# Patient Record
Sex: Female | Born: 1995 | Hispanic: No | Marital: Single | Smoking: Never smoker
Health system: Southern US, Community
[De-identification: ages and names within clinical notes are randomized; demographics above are authoritative.]

## PROBLEM LIST (undated history)

## (undated) DIAGNOSIS — F329 Major depressive disorder, single episode, unspecified: Secondary | ICD-10-CM

## (undated) DIAGNOSIS — F32A Depression, unspecified: Secondary | ICD-10-CM

## (undated) DIAGNOSIS — F419 Anxiety disorder, unspecified: Secondary | ICD-10-CM

## (undated) DIAGNOSIS — F909 Attention-deficit hyperactivity disorder, unspecified type: Secondary | ICD-10-CM

## (undated) HISTORY — DX: Anxiety disorder, unspecified: F41.9

## (undated) HISTORY — DX: Major depressive disorder, single episode, unspecified: F32.9

## (undated) HISTORY — DX: Depression, unspecified: F32.A

## (undated) HISTORY — DX: Attention-deficit hyperactivity disorder, unspecified type: F90.9

---
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
## 2017-01-15 ENCOUNTER — Encounter (HOSPITAL_COMMUNITY): Payer: Self-pay | Admitting: Psychiatry

## 2017-01-15 ENCOUNTER — Ambulatory Visit (INDEPENDENT_AMBULATORY_CARE_PROVIDER_SITE_OTHER): Payer: 59 | Admitting: Psychiatry

## 2017-01-15 DIAGNOSIS — F902 Attention-deficit hyperactivity disorder, combined type: Secondary | ICD-10-CM

## 2017-01-15 DIAGNOSIS — F900 Attention-deficit hyperactivity disorder, predominantly inattentive type: Secondary | ICD-10-CM | POA: Diagnosis not present

## 2017-01-15 MED ORDER — LISDEXAMFETAMINE DIMESYLATE 30 MG PO CAPS: 30.0000 mg | ORAL_CAPSULE | Freq: Every day | ORAL | 0 refills | Status: DC

## 2017-01-15 NOTE — Progress Notes (Signed)
BH MD/PA/NP OP Progress Note  01/15/2017 9:02 AM Natasha Collins  MRN:  [id number]  Chief Complaint:  Subjective:  I like new medication.  I'm not hair picking.  HPI: Natasha Collins came for her follow-up appointment.  She is taking Vyvanse 30 mg and she didn't like this medication.  Her attention, focus, multitasking and concentration is good.  She is not engaging in hair picking which she believed caused by Concerta.  She is in summer classes at [employer].  She like to do nursing in the future.  She is having good grades.  Patient denies any insomnia, irritability, crying spells, agitation, suicidal thoughts or any hopelessness.  Patient denies drinking alcohol or using any illegal substances.  Currently she is living with her father and stepmother.  She admitted not able to see therapist but wondering if she can resume counseling with Boneta Lucks.  Her energy level is good.  Her vital signs are stable.  Visit Diagnosis:    ICD-10-CM   1. ADHD (attention deficit hyperactivity disorder), combined type F90.2 lisdexamfetamine (VYVANSE) 30 MG capsule    DISCONTINUED: lisdexamfetamine (VYVANSE) 30 MG capsule    DISCONTINUED: lisdexamfetamine (VYVANSE) 30 MG capsule    Past Psychiatric History: Reviewed. Patient has history of ADD and seen multiple psychiatrist and provider in this office.  She used to take higher dose of Concerta but slowly and gradually it has been cut down due to anxiety and nervousness and lately causing hair picking.  She also took Lexapro but feeling much better and it was discontinued.  Patient denies any history of suicidal attempt, psychiatric inpatient treatment, psychosis, mania or any aggressive behavior.  Past Medical History:  Past Medical History:  Diagnosis Date  . ADHD (attention deficit hyperactivity disorder)   . Anxiety   . Depression    History reviewed. No pertinent surgical history.  Family Psychiatric History: Reviewed.  Family History: History reviewed. No  pertinent family history.  Social History:  Social History   Social History  . Marital status: Single    Spouse name: N/A  . Number of children: N/A  . Years of education: N/A   Social History Main Topics  . Smoking status: Never Smoker  . Smokeless tobacco: Never Used  . Alcohol use No  . Drug use: No  . Sexual activity: Not Currently    Partners: Male    Birth control/ protection: Pill   Other Topics Concern  . None   Social History Narrative  . None    Allergies: No Known Allergies  Metabolic Disorder Labs: Lab Results  Component Value Date   HGBA1C 5.1 06/05/2016   MPG 100 06/05/2016   No results found for: PROLACTIN No results found for: CHOL, TRIG, HDL, CHOLHDL, VLDL, LDLCALC   Current Medications: Current Outpatient Prescriptions  Medication Sig Dispense Refill  . lisdexamfetamine (VYVANSE) 30 MG capsule Take 1 capsule (30 mg total) by mouth daily. 30 capsule 0   No current facility-administered medications for this visit.     Neurologic: Headache: No Seizure: Yes Paresthesias: No  Musculoskeletal: Strength & Muscle Tone: within normal limits Gait & Station: normal Patient leans: N/A  Psychiatric Specialty Exam: ROS  Blood pressure 102/68, pulse 82, height 5' 5.75" (1.67 m), weight 135 lb 6.4 oz (61.4 kg).Body mass index is 22.02 kg/m.  General Appearance: Casual and Pleasant  Eye Contact:  Good  Speech:  Clear and Coherent  Volume:  Normal  Mood:  Euthymic  Affect:  Appropriate  Thought Process:  Goal  Directed  Orientation:  Full (Time, Place, and Person)  Thought Content: Logical   Suicidal Thoughts:  No  Homicidal Thoughts:  No  Memory:  Immediate;   Good Recent;   Good Remote;   Good  Judgement:  Good  Insight:  Good  Psychomotor Activity:  Normal  Concentration:  Concentration: Good and Attention Span: Good  Recall:  Good  Fund of Knowledge: Good  Language: Good  Akathisia:  No  Handed:  Right  AIMS (if indicated):  0   Assets:  Communication Skills Desire for Improvement Resilience Social Support Talents/Skills  ADL's:  Intact  Cognition: WNL  Sleep:  Good    Assessment: Attention deficit disorder, inattentive type.  Plan: Patient like Vyvanse and she had no side effects.  She has no tremors shakes or any EPS.  She is pleased that it is not causing hair picking.  Continue Vyvanse 30 mg daily.  She also like to resume counseling with Boneta LucksJennifer Brown and we will schedule appointment.  Recommended to call us back if she has any question, concern or if she feels worsening of the symptom.  Follow-up in 3 months.  ARFEEN,SYED T., MD 01/15/2017, 9:02 AM

## 2017-02-18 ENCOUNTER — Ambulatory Visit (INDEPENDENT_AMBULATORY_CARE_PROVIDER_SITE_OTHER): Payer: 59 | Admitting: Psychiatry

## 2017-02-18 DIAGNOSIS — F902 Attention-deficit hyperactivity disorder, combined type: Secondary | ICD-10-CM

## 2017-02-18 DIAGNOSIS — F411 Generalized anxiety disorder: Secondary | ICD-10-CM

## 2017-02-19 NOTE — Progress Notes (Signed)
   THERAPIST PROGRESS NOTE  Session Time: 2:05-3:00  Participation Level: Active  Behavioral Response: CasualAlertEuthymicAppropriatelyTearful  Type of Therapy: Individual Therapy  Treatment Goals addressed: Coping  Interventions: CBT, Strength-based and Supportive  Summary: Natasha Collins is a 21 y.o. female who presents with depression, ADHD.   Suicidal/Homicidal: Nowithout intent/plan  Therapist Response:  This was Pt.'s first session in over a year since going to college. Pt. Reported that the year was mostly successful. Pt. Reported that she had some success socially in letting go of toxic friend group from home. Pt. 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 Pt. Discussed challenge of underage drinking citation and punishment from her father of having to come home every weekend which made it difficult to make friends and transition socially on campus. Pt. 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 Significant time in session was spent processing ongoing them of vulnerability and allowing herself to be fully present in emotionally intimate and loving relationships with her father and new boyfriend. Pt. Was encouraged to pursue counseling with on campus counseling center. Pt. Was also recommended bibliotherapy to continue work on vulnerability and communicating her feelings to significant others.   Plan: Pt. To continue with CBT-based therapy. Return again in 4 weeks.  Diagnosis:Axis I: Depressive Disorder NOS; PTSD  Axis II: No diagnosis  Shaune Pollack, [facility] 02/19/2017

## 2017-04-09 ENCOUNTER — Ambulatory Visit (HOSPITAL_COMMUNITY): Payer: 59 | Admitting: Psychiatry

## 2017-05-20 ENCOUNTER — Ambulatory Visit (HOSPITAL_COMMUNITY): Payer: 59 | Admitting: Psychiatry

## 2021-04-16 ENCOUNTER — Other Ambulatory Visit (HOSPITAL_BASED_OUTPATIENT_CLINIC_OR_DEPARTMENT_OTHER): Payer: Self-pay

## 2021-04-16 MED ORDER — INFLUENZA VAC SPLIT QUAD 0.5 ML IM SUSY
PREFILLED_SYRINGE | INTRAMUSCULAR | 0 refills | Status: DC
  Filled 2021-04-16: qty 0.5, 1d supply, fill #0

## 2021-06-02 ENCOUNTER — Other Ambulatory Visit: Payer: Self-pay

## 2021-06-02 ENCOUNTER — Ambulatory Visit: Payer: Managed Care, Other (non HMO) | Admitting: Family Medicine

## 2021-06-02 ENCOUNTER — Encounter: Payer: Self-pay | Admitting: Family Medicine

## 2021-06-02 VITALS — BP 110/70 | HR 94 | Temp 97.6°F | Ht 66.0 in | Wt 138.2 lb

## 2021-06-02 DIAGNOSIS — J45990 Exercise induced bronchospasm: Secondary | ICD-10-CM | POA: Diagnosis not present

## 2021-06-02 DIAGNOSIS — Z0000 Encounter for general adult medical examination without abnormal findings: Secondary | ICD-10-CM

## 2021-06-02 MED ORDER — ALBUTEROL SULFATE HFA 108 (90 BASE) MCG/ACT IN AERS: INHALATION_SPRAY | RESPIRATORY_TRACT | 0 refills | Status: DC

## 2021-06-02 NOTE — Progress Notes (Signed)
New Patient Office Visit  Subjective:  Patient ID: Natasha Collins, female    DOB: [birth year]  Age: 25 y.o. MRN: [id number]  CC:  Chief Complaint  Patient presents with   Establish Care    NP/establish care concerns about chest becoming tight after physical activity. Would like titers drawn.     HPI Natasha Collins presents for establishment of care a health check for nursing school and tightness in the chest with aerobic activity.  Patient does not smoke and has no history of asthma.  No known family history of asthma.  She notices tightness when she is exerting herself aerobically.  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  She is scheduled to start nursing school soon and needs titers drawn.  Past Medical History:  Diagnosis Date   ADHD (attention deficit hyperactivity disorder)    Anxiety    Depression     History reviewed. No pertinent surgical history.  Family History  Problem Relation Age of Onset   Alcohol abuse Mother     Social History   Socioeconomic History   Marital status: Single    Spouse name: Not on file   Number of children: Not on file   Years of education: Not on file   Highest education level: Not on file  Occupational History   Not on file  Tobacco Use   Smoking status: Never   Smokeless tobacco: Never  Vaping Use   Vaping Use: Never used  Substance and Sexual Activity   Alcohol use: Yes    Comment: social   Drug use: No   Sexual activity: Yes    Partners: Male    Birth control/protection: I.U.D.  Other Topics Concern   Not on file  Social History Narrative   Not on file   Social Determinants of Health   Financial Resource Strain: Not on file  Food Insecurity: Not on file  Transportation Needs: Not on file  Physical Activity: Not on file  Stress: Not on file  Social Connections: Not on file  Intimate Partner Violence: Not on file    ROS Review of Systems   Constitutional:  Negative for chills, diaphoresis, fatigue, fever and unexpected weight change.  HENT: Negative.    Eyes:  Negative for photophobia and visual disturbance.  Respiratory:  Positive for wheezing. Negative for shortness of breath.   Cardiovascular: Negative.   Gastrointestinal: Negative.   Genitourinary: Negative.   Musculoskeletal:  Negative for gait problem and joint swelling.  Neurological:  Negative for speech difficulty and weakness.  Psychiatric/Behavioral: Negative.    Depression screen [facility] 2/9 06/02/2021  Decreased Interest 0  Down, Depressed, Hopeless 0  PHQ - 2 Score 0     Objective:   Today's Vitals: BP 110/70 (BP Location: Right Arm, Patient Position: Sitting, Cuff Size: Normal)   Pulse 94   Temp 97.6 F (36.4 C) (Temporal)   Ht 5\' 6"  (1.676 m)   Wt 138 lb 3.2 oz (62.7 kg)   SpO2 98%   BMI 22.31 kg/m   Physical Exam Vitals and nursing note reviewed.  Constitutional:      General: She is not in acute distress.    Appearance: Normal appearance. She is normal weight. She is not ill-appearing, toxic-appearing or diaphoretic.  HENT:     Head: Normocephalic and atraumatic.     Right Ear: Tympanic membrane, ear canal and external ear normal.     Left  Ear: Tympanic membrane, ear canal and external ear normal.     Mouth/Throat:     Mouth: Mucous membranes are moist.     Pharynx: Oropharynx is clear. No oropharyngeal exudate or posterior oropharyngeal erythema.  Eyes:     General:        Right eye: No discharge.        Left eye: No discharge.     Extraocular Movements: Extraocular movements intact.     Conjunctiva/sclera: Conjunctivae normal.     Pupils: Pupils are equal, round, and reactive to light.  Neck:     Vascular: No carotid bruit.  Cardiovascular:     Rate and Rhythm: Normal rate and regular rhythm.  Pulmonary:     Effort: Pulmonary effort is normal.     Breath sounds: Normal breath sounds.  Abdominal:     General: Bowel sounds are  normal.  Musculoskeletal:     Cervical back: No rigidity or tenderness.     Right lower leg: No edema.     Left lower leg: No edema.  Lymphadenopathy:     Cervical: No cervical adenopathy.  Skin:    General: Skin is warm and dry.  Neurological:     Mental Status: She is alert and oriented to person, place, and time.  Psychiatric:        Mood and Affect: Mood normal.        Behavior: Behavior normal.    Assessment & Plan:   Problem List Items Addressed This Visit   None Visit Diagnoses     Healthcare maintenance    -  Primary   Relevant Orders   Measles/Mumps/Rubella Immunity   Varicella zoster antibody, IgG   Hepatitis B surface antibody,quantitative   QuantiFERON-TB Gold Plus   Pulmonary function test   CBC   Comprehensive metabolic panel   Exercise-induced asthma       Relevant Medications   albuterol (VENTOLIN HFA) 108 (90 Base) MCG/ACT inhaler       Outpatient Encounter Medications as of 06/02/2021  Medication Sig   azithromycin (ZITHROMAX) 250 MG tablet Take 250 mg by mouth as directed.   levonorgestrel (MIRENA, 52 MG,) 20 MCG/DAY IUD Mirena 20 mcg/24 hours (8 yrs) 52 mg intrauterine device  Take by intrauterine route.   [DISCONTINUED] albuterol (VENTOLIN HFA) 108 (90 Base) MCG/ACT inhaler One puff prior to aerobic activity. No more than 3 daily.   albuterol (VENTOLIN HFA) 108 (90 Base) MCG/ACT inhaler One puff prior to aerobic activity. No more than 3 daily.   [DISCONTINUED] influenza vac split quadrivalent PF (FLUARIX) 0.5 ML injection Inject into the muscle.   [DISCONTINUED] lisdexamfetamine (VYVANSE) 30 MG capsule Take 1 capsule (30 mg total) by mouth daily.   No facility-administered encounter medications on file as of 06/02/2021.    Follow-up: Return in about 3 months (around 09/02/2021).   Information was given on health maintenance and disease prevention as well as exercise-induced asthma.  She will use a Ventolin inhaler prior to aerobic activity, no  more than 3 times daily.  Also for formal lung function testing.  Checking titers required for nursing school.  To have gotten Ms. Laurine Blazer ready to go would you ask her to make sure the pharmacy shows her how to use the inhaler but she is going to go to the lab  Mliss Sax, MD

## 2021-06-03 LAB — COMPREHENSIVE METABOLIC PANEL
ALT: 23 U/L (ref 0–35)
AST: 24 U/L (ref 0–37)
Albumin: 4.4 g/dL (ref 3.5–5.2)
Alkaline Phosphatase: 62 U/L (ref 39–117)
BUN: 19 mg/dL (ref 6–23)
CO2: 29 mEq/L (ref 19–32)
Calcium: 8.9 mg/dL (ref 8.4–10.5)
Chloride: 104 mEq/L (ref 96–112)
Creatinine, Ser: 0.89 mg/dL (ref 0.40–1.20)
GFR: 91.47 mL/min (ref >60.00)
Glucose, Bld: 95 mg/dL (ref 70–99)
Potassium: 4.1 mEq/L (ref 3.5–5.1)
Sodium: 140 mEq/L (ref 135–145)
Total Bilirubin: 0.4 mg/dL (ref 0.2–1.2)
Total Protein: 6.8 g/dL (ref 6.0–8.3)

## 2021-06-03 LAB — CBC
HCT: 40.1 % (ref 36.0–46.0)
Hemoglobin: 13.7 g/dL (ref 12.0–15.0)
MCHC: 34.1 g/dL (ref 30.0–36.0)
MCV: 95.2 fl (ref 78.0–100.0)
Platelets: 310 10*3/uL (ref 150.0–400.0)
RBC: 4.22 Mil/uL (ref 3.87–5.11)
RDW: 12.6 % (ref 11.5–15.5)
WBC: 5.9 10*3/uL (ref 4.0–10.5)

## 2021-06-04 LAB — QUANTIFERON-TB GOLD PLUS
Mitogen-NIL: >10 IU/mL
NIL: 0.02 IU/mL
QuantiFERON-TB Gold Plus: NEGATIVE
TB1-NIL: 0 IU/mL
TB2-NIL: 0 IU/mL

## 2021-06-04 LAB — MEASLES/MUMPS/RUBELLA IMMUNITY
Mumps IgG: 37 AU/mL
Rubella: 2.1 Index
Rubeola IgG: >300 AU/mL

## 2021-06-04 LAB — VARICELLA ZOSTER ANTIBODY, IGG: Varicella IgG: 309 index

## 2021-06-04 LAB — HEPATITIS B SURFACE ANTIBODY, QUANTITATIVE: Hepatitis B-Post: 60 m[IU]/mL (ref >=10)

## 2021-09-02 ENCOUNTER — Ambulatory Visit: Payer: Managed Care, Other (non HMO) | Admitting: Family Medicine

## 2021-09-09 ENCOUNTER — Other Ambulatory Visit: Payer: Self-pay

## 2021-09-09 ENCOUNTER — Ambulatory Visit: Payer: Managed Care, Other (non HMO) | Admitting: Family Medicine

## 2021-09-09 ENCOUNTER — Encounter: Payer: Self-pay | Admitting: Family Medicine

## 2021-09-09 VITALS — BP 102/68 | HR 69 | Temp 97.3°F | Ht 66.0 in | Wt 136.4 lb

## 2021-09-09 DIAGNOSIS — B360 Pityriasis versicolor: Secondary | ICD-10-CM | POA: Insufficient documentation

## 2021-09-09 DIAGNOSIS — J45990 Exercise induced bronchospasm: Secondary | ICD-10-CM

## 2021-09-09 DIAGNOSIS — J4540 Moderate persistent asthma, uncomplicated: Secondary | ICD-10-CM

## 2021-09-09 DIAGNOSIS — J301 Allergic rhinitis due to pollen: Secondary | ICD-10-CM

## 2021-09-09 MED ORDER — CETIRIZINE HCL 10 MG PO TABS: 10.0000 mg | ORAL_TABLET | Freq: Every day | ORAL | 11 refills | Status: AC

## 2021-09-09 MED ORDER — FLUTICASONE-SALMETEROL 250-50 MCG/ACT IN AEPB: 1.0000 | INHALATION_SPRAY | Freq: Two times a day (BID) | RESPIRATORY_TRACT | 2 refills | Status: AC

## 2021-09-09 MED ORDER — ALBUTEROL SULFATE HFA 108 (90 BASE) MCG/ACT IN AERS: INHALATION_SPRAY | RESPIRATORY_TRACT | 0 refills | Status: DC

## 2021-09-09 NOTE — Progress Notes (Signed)
? ?Established Patient Office Visit ? ?Subjective:  ?Patient ID: Natasha Collins, female    DOB: [birth year]  Age: 26 y.o. MRN: [id number] ? ?CC:  ?Chief Complaint  ?Patient presents with  ? Follow-up  ?  Follow up on asthma, no concerns.   ? ? ?HPI ?Natasha Collins presents for follow-up of reactive airway disease with activity induced asthma.  Here today for albuterol worked great for use prior to exercise.  Over the last week or so while walking outside she developed itchy watery eyes sneezing and postnasal drip.  She also noticed some tightness in her chest.  Tells me that her grandmother remembers her coughing a lot this time of year when she was a little girl.  She has been exercising more often with the warmer weather and has been using her albuterol 4 times weekly.  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 ? ?Past Medical History:  ?Diagnosis Date  ? ADHD (attention deficit hyperactivity disorder)   ? Anxiety   ? Depression   ? ? ?No past surgical history on file. ? ?Family History  ?Problem Relation Age of Onset  ? Alcohol abuse Mother   ? ? ?Social History  ? ?Socioeconomic History  ? Marital status: Single  ?  Spouse name: Not on file  ? Number of children: Not on file  ? Years of education: Not on file  ? Highest education level: Not on file  ?Occupational History  ? Not on file  ?Tobacco Use  ? Smoking status: Never  ? Smokeless tobacco: Never  ?Vaping Use  ? Vaping Use: Never used  ?Substance and Sexual Activity  ? Alcohol use: Yes  ?  Comment: social  ? Drug use: No  ? Sexual activity: Yes  ?  Partners: Male  ?  Birth control/protection: I.U.D.  ?Other Topics Concern  ? Not on file  ?Social History Narrative  ? Not on file  ? ?Social Determinants of Health  ? ?Financial Resource Strain: Not on file  ?Food Insecurity: Not on file  ?Transportation Needs: Not on file  ?Physical Activity: Not on file  ?Stress: Not on file  ?Social Connections: Not on file  ?Intimate Partner Violence: Not on file   ? ? ?Outpatient Medications Prior to Visit  ?Medication Sig Dispense Refill  ? levonorgestrel (MIRENA, 52 MG,) 20 MCG/DAY IUD Mirena 20 mcg/24 hours (8 yrs) 52 mg intrauterine device ? Take by intrauterine route.    ? albuterol (VENTOLIN HFA) 108 (90 Base) MCG/ACT inhaler One puff prior to aerobic activity. No more than 3 daily. 8 g 0  ? azithromycin (ZITHROMAX) 250 MG tablet Take 250 mg by mouth as directed.    ? ?No facility-administered medications prior to visit.  ? ? ?No Known Allergies ? ?ROS ?Review of Systems  ?Constitutional:  Negative for diaphoresis, fatigue, fever and unexpected weight change.  ?HENT:  Positive for congestion, postnasal drip and sneezing.   ?Eyes:  Positive for itching. Negative for photophobia and visual disturbance.  ?Respiratory:  Positive for wheezing.   ?Cardiovascular: Negative.   ?Gastrointestinal: Negative.   ?Genitourinary: Negative.   ?Neurological:  Negative for speech difficulty, weakness and light-headedness.  ? ?  ?Objective:  ?  ?Physical Exam ?Vitals and nursing note reviewed.  ?Constitutional:   ?   General: She is not in acute distress. ?   Appearance: Normal appearance. She is normal weight. She is not ill-appearing, toxic-appearing or diaphoretic.  ?HENT:  ?   Head: Normocephalic  and atraumatic.  ?   Right Ear: External ear normal.  ?   Left Ear: External ear normal.  ?   Mouth/Throat:  ?   Mouth: Mucous membranes are moist.  ?   Pharynx: Oropharynx is clear. No oropharyngeal exudate or posterior oropharyngeal erythema.  ?Eyes:  ?   General: No scleral icterus.    ?   Right eye: No discharge.     ?   Left eye: No discharge.  ?   Extraocular Movements: Extraocular movements intact.  ?   Conjunctiva/sclera: Conjunctivae normal.  ?   Pupils: Pupils are equal, round, and reactive to light.  ?Cardiovascular:  ?   Rate and Rhythm: Normal rate and regular rhythm.  ?Pulmonary:  ?   Effort: Pulmonary effort is normal. No respiratory distress.  ?   Breath sounds: Normal breath  sounds. No wheezing.  ?Musculoskeletal:  ?   Cervical back: No rigidity or tenderness.  ?Lymphadenopathy:  ?   Cervical: No cervical adenopathy.  ?Skin: ?   General: Skin is warm and dry.  ?Neurological:  ?   Mental Status: She is alert and oriented to person, place, and time.  ?Psychiatric:     ?   Mood and Affect: Mood normal.     ?   Behavior: Behavior normal.  ? ? ?BP 102/68 (BP Location: Right Arm, Patient Position: Sitting, Cuff Size: Normal)   Pulse 69   Temp (!) 97.3 ?F (36.3 ?C) (Temporal)   Ht 5\' 6"  (1.676 m)   Wt 136 lb 6.4 oz (61.9 kg)   SpO2 97%   BMI 22.02 kg/m?  ?Wt Readings from Last 3 Encounters:  ?09/09/21 136 lb 6.4 oz (61.9 kg)  ?06/02/21 138 lb 3.2 oz (62.7 kg)  ?02/22/14 126 lb 12.8 oz (57.5 kg) (64 %, Z= 0.37)*  ? ?* Growth percentiles are based on CDC (Girls, 2-20 Years) data.  ? ? ? ?Health Maintenance Due  ?Topic Date Due  ? HPV VACCINES (1 - 2-dose series) Never done  ? HIV Screening  Never done  ? Hepatitis C Screening  Never done  ? ? ?   ?Topic Date Due  ? HPV VACCINES (1 - 2-dose series) Never done  ? ? ?Lab Results  ?Component Value Date  ? TSH 0.97 06/05/2016  ? ?Lab Results  ?Component Value Date  ? WBC 5.9 06/02/2021  ? HGB 13.7 06/02/2021  ? HCT 40.1 06/02/2021  ? MCV 95.2 06/02/2021  ? PLT 310.0 06/02/2021  ? ?Lab Results  ?Component Value Date  ? NA 140 06/02/2021  ? K 4.1 06/02/2021  ? CO2 29 06/02/2021  ? GLUCOSE 95 06/02/2021  ? BUN 19 06/02/2021  ? CREATININE 0.89 06/02/2021  ? BILITOT 0.4 06/02/2021  ? ALKPHOS 62 06/02/2021  ? AST 24 06/02/2021  ? ALT 23 06/02/2021  ? PROT 6.8 06/02/2021  ? ALBUMIN 4.4 06/02/2021  ? CALCIUM 8.9 06/02/2021  ? GFR 91.47 06/02/2021  ? ?No results found for: CHOL ?No results found for: HDL ?No results found for: LDLCALC ?No results found for: TRIG ?No results found for: CHOLHDL ?Lab Results  ?Component Value Date  ? HGBA1C 5.1 06/05/2016  ? ? ?  ?Assessment & Plan:  ? ?Problem List Items Addressed This Visit   ?None ?Visit Diagnoses   ? ?  Seasonal allergic rhinitis due to pollen    -  Primary  ? Relevant Medications  ? cetirizine (ZYRTEC) 10 MG tablet  ? Moderate persistent reactive airway disease without  complication      ? Relevant Medications  ? fluticasone-salmeterol (ADVAIR) 250-50 MCG/ACT AEPB  ? Exercise-induced asthma      ? Relevant Medications  ? fluticasone-salmeterol (ADVAIR) 250-50 MCG/ACT AEPB  ? albuterol (VENTOLIN HFA) 108 (90 Base) MCG/ACT inhaler  ? ?  ? ? ?Meds ordered this encounter  ?Medications  ? fluticasone-salmeterol (ADVAIR) 250-50 MCG/ACT AEPB  ?  Sig: Inhale 1 puff into the lungs in the morning and at bedtime. Rinse with water after puffs.  ?  Dispense:  3 each  ?  Refill:  2  ? cetirizine (ZYRTEC) 10 MG tablet  ?  Sig: Take 1 tablet (10 mg total) by mouth daily.  ?  Dispense:  30 tablet  ?  Refill:  11  ? albuterol (VENTOLIN HFA) 108 (90 Base) MCG/ACT inhaler  ?  Sig: One puff prior to aerobic activity. No more than 3 daily.  ?  Dispense:  8 g  ?  Refill:  0  ?  Instruct use of inhaler please.  ? ? ?Follow-up: Return in about 6 months (around 03/12/2022), or if symptoms worsen or fail to improve.  ?We will add controller.  Discussed the rationale for that use.  Information was given on asthma as well as exercise-induced asthma.  Follow-up in 3 to 6 months. ? ?Mliss Sax, MD ?

## 2021-09-14 ENCOUNTER — Encounter: Payer: Self-pay | Admitting: Family Medicine

## 2022-02-10 ENCOUNTER — Telehealth: Payer: Self-pay | Admitting: Family Medicine

## 2022-02-10 ENCOUNTER — Ambulatory Visit: Payer: Managed Care, Other (non HMO) | Admitting: Family Medicine

## 2022-02-10 NOTE — Telephone Encounter (Signed)
8.8.23 no show letter sent 

## 2022-02-12 NOTE — Telephone Encounter (Signed)
1st no show, fee waived, letter sent 

## 2022-09-23 ENCOUNTER — Other Ambulatory Visit: Payer: Self-pay | Admitting: Family Medicine

## 2022-09-23 DIAGNOSIS — J45990 Exercise induced bronchospasm: Secondary | ICD-10-CM
